# Patient Record
Sex: Female | Born: 1990 | Race: White | Hispanic: No | Marital: Married | Smoking: Never smoker
Health system: Southern US, Community
[De-identification: ages and names within clinical notes are randomized; demographics above are authoritative.]

## PROBLEM LIST (undated history)

## (undated) ENCOUNTER — Inpatient Hospital Stay: Payer: Self-pay

## (undated) ENCOUNTER — Inpatient Hospital Stay: Admission: AD | Payer: Self-pay | Source: Home / Self Care

## (undated) DIAGNOSIS — Z789 Other specified health status: Secondary | ICD-10-CM

## (undated) HISTORY — PX: NO PAST SURGERIES: SHX2092

---
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
## 2015-12-27 LAB — OB RESULTS CONSOLE HIV ANTIBODY (ROUTINE TESTING): HIV: NONREACTIVE

## 2015-12-27 LAB — OB RESULTS CONSOLE VARICELLA ZOSTER ANTIBODY, IGG: VARICELLA IGG: IMMUNE

## 2015-12-27 LAB — OB RESULTS CONSOLE HEPATITIS B SURFACE ANTIGEN: Hepatitis B Surface Ag: NEGATIVE

## 2015-12-27 LAB — OB RESULTS CONSOLE RUBELLA ANTIBODY, IGM: Rubella: IMMUNE

## 2015-12-30 ENCOUNTER — Other Ambulatory Visit: Payer: Self-pay | Admitting: Obstetrics and Gynecology

## 2015-12-30 DIAGNOSIS — Z369 Encounter for antenatal screening, unspecified: Secondary | ICD-10-CM

## 2016-01-09 ENCOUNTER — Ambulatory Visit
Admission: RE | Admit: 2016-01-09 | Discharge: 2016-01-09 | Disposition: A | Discharge status: Home / Self Care | Payer: Managed Care, Other (non HMO) | Source: Ambulatory Visit | Attending: Obstetrics and Gynecology | Admitting: Obstetrics and Gynecology

## 2016-01-09 ENCOUNTER — Ambulatory Visit (HOSPITAL_BASED_OUTPATIENT_CLINIC_OR_DEPARTMENT_OTHER)
Admission: RE | Admit: 2016-01-09 | Discharge: 2016-01-09 | Disposition: A | Discharge status: Home / Self Care | Payer: Managed Care, Other (non HMO) | Source: Ambulatory Visit | Attending: Maternal and Fetal Medicine | Admitting: Maternal and Fetal Medicine

## 2016-01-09 DIAGNOSIS — Z3481 Encounter for supervision of other normal pregnancy, first trimester: Secondary | ICD-10-CM | POA: Insufficient documentation

## 2016-01-09 DIAGNOSIS — Z3A12 12 weeks gestation of pregnancy: Secondary | ICD-10-CM | POA: Diagnosis not present

## 2016-01-09 DIAGNOSIS — Z36 Encounter for antenatal screening of mother: Secondary | ICD-10-CM | POA: Diagnosis not present

## 2016-01-09 DIAGNOSIS — Z369 Encounter for antenatal screening, unspecified: Secondary | ICD-10-CM | POA: Insufficient documentation

## 2016-01-09 HISTORY — DX: Other specified health status: Z78.9

## 2016-01-09 NOTE — Progress Notes (Signed)
Referring physician:  [facility] Length of Consultation: 40 minutes   Madison Knox  was referred to [facility] for genetic counseling to review prenatal screening and testing options.  This note summarizes the information we discussed.    We offered the following routine screening tests for this pregnancy:  First trimester screening, which includes nuchal translucency ultrasound screen and first trimester maternal serum marker screening.  The nuchal translucency has approximately an 80% detection rate for Down syndrome and can be positive for other chromosome abnormalities as well as congenital heart defects.  When combined with a maternal serum marker screening, the detection rate is up to 90% for Down syndrome and up to 97% for trisomy 18.     Maternal serum marker screening, a blood test that measures pregnancy proteins, can provide risk assessments for Down syndrome, trisomy 18, and open neural tube defects (spina bifida, anencephaly). Because it does not directly examine the fetus, it cannot positively diagnose or rule out these problems.  Targeted ultrasound uses high frequency sound waves to create an image of the developing fetus.  An ultrasound is often recommended as a routine means of evaluating the pregnancy.  It is also used to screen for fetal anatomy problems (for example, a heart defect) that might be suggestive of a chromosomal or other abnormality.   Should these screening tests indicate an increased concern, then the following additional testing options would be offered:  The chorionic villus sampling procedure is available for first trimester chromosome analysis.  This involves the withdrawal of a small amount of chorionic villi (tissue from the developing placenta).  Risk of pregnancy loss is estimated to be approximately 1 in 200 to 1 in 100 (0.5 to 1%).  There is approximately a 1% (1 in 100) chance that the CVS chromosome results will be unclear.   Chorionic villi cannot be tested for neural tube defects.     Amniocentesis involves the removal of a small amount of amniotic fluid from the sac surrounding the fetus with the use of a thin needle inserted through the maternal abdomen and uterus.  Ultrasound guidance is used throughout the procedure.  Fetal cells from amniotic fluid are directly evaluated and > 99.5% of chromosome problems and > 98% of open neural tube defects can be detected. This procedure is generally performed after the 15th week of pregnancy.  The main risks to this procedure include complications leading to miscarriage in less than 1 in 200 cases (0.5%).  As another option for information if the pregnancy is suspected to be an an increased chance for certain chromosome conditions, we also reviewed the availability of cell free fetal DNA testing from maternal blood to determine whether or not the baby may have either Down syndrome, trisomy 16, or trisomy 44.  This test utilizes a maternal blood sample and DNA sequencing technology to isolate circulating cell free fetal DNA from maternal plasma.  The fetal DNA can then be analyzed for DNA sequences that are derived from the three most common chromosomes involved in aneuploidy, chromosomes 13, 18, and 21.  If the overall amount of DNA is greater than the expected level for any of these chromosomes, aneuploidy is suspected.  While we do not consider it a replacement for invasive testing and karyotype analysis, a negative result from this testing would be reassuring, though not a guarantee of a normal chromosome complement for the baby.  An abnormal result is certainly suggestive of an abnormal chromosome complement, though  we would still recommend CVS or amniocentesis to confirm any findings from this testing.    Cystic Fibrosis and Spinal Muscular Atrophy (SMA) screening were also discussed with the patient. Both conditions are recessive, which means that both parents must be carriers in  order to have a child with the disease.  Cystic fibrosis (CF) is one of the most common genetic conditions in persons of Caucasian ancestry.  This condition occurs in approximately 1 in 2,500 Caucasian persons and results in thickened secretions in the lungs, digestive, and reproductive systems.  For a baby to be at risk for having CF, both of the parents must be carriers for this condition.  Approximately 1 in 64 Caucasian persons is a carrier for CF.  Current carrier testing looks for the most common mutations in the gene for CF and can detect approximately 90% of carriers in the Caucasian population.  This means that the carrier screening can greatly reduce, but cannot eliminate, the chance for an individual to have a child with CF.  If an individual is found to be a carrier for CF, then carrier testing would be available for the partner. As part of [name]'s newborn screening profile, all babies born in the state of [place] will have a two-tier screening process.  Specimens are first tested to determine the concentration of immunoreactive trypsinogen (IRT).  The top 5% of specimens with the highest IRT values then undergo DNA testing using a panel of over 40 common CF mutations. SMA is a neurodegenerative disorder that leads to atrophy of skeletal muscle and overall weakness.  This condition is also more prevalent in the Caucasian population, with 1 in 40-1 in 60 persons being a carrier and 1 in 6,000-1 in 10,000 children being affected.  There are multiple forms of the disease, with some causing death in infancy to other forms with survival into adulthood.  The genetics of SMA is complex, but carrier screening can detect up to 95% of carriers in the Caucasian population.  Similar to CF, a negative result can greatly reduce, but cannot eliminate, the chance to have a child with SMA.  We obtained a detailed family history and pregnancy history.  The father of the baby is reported to have a family  history of esophageal cancer in his brother and his father.  Both had a history of alcohol use which may have contributed.  Esophageal cancer is not thought to have strong genetic factors in most families, though there are some reports of genetic contributions in some studies.  The remainder of the family history was reported to be unremarkable for birth defects, mental retardation, recurrent pregnancy loss or known chromosome abnormalities.  Madison Knox stated that this is her second pregnancy.  She and her husband have a healthy 66 year old daughter.  She reported no complications or exposures that would be expected to increase the chance for birth defects.  After consideration of the options, Madison Knox elected to proceed with first trimester screening.  She declined CF and SMA carrier screening.  Madison Knox said that her husband works for [employer] and all of her testing at [employer] would be free of charge.  We therefore did discuss In[employer] testing which she declined today but may consider if the results of the first trimester screening are abnormal.  An ultrasound was performed at the time of the visit. Please refer to the ultrasound report for details of that study.   Fetal anatomy could not be assessed due to  early gestational age.  We therefore recommend an ultrasound for anatomy in the second trimester.  Madison Knox was encouraged to call with questions or concerns.  We can be contacted at [phone].   Cherly Andersoneborah F. Alexi Geibel, MS, CGC

## 2016-01-12 ENCOUNTER — Telehealth: Payer: Self-pay | Admitting: Obstetrics and Gynecology

## 2016-01-12 NOTE — Telephone Encounter (Signed)
Ms. Christell ConstantMoore  elected to undergo First Trimester screening on 01/09/2016.  To review, first trimester screening, includes nuchal translucency ultrasound screen and/or first trimester maternal serum marker screening.  The nuchal translucency has approximately an 80% detection rate for Down syndrome and can be positive for other chromosome abnormalities as well as heart defects.  When combined with a maternal serum marker screening, the detection rate is up to 90% for Down syndrome and up to 97% for trisomy 13 and 18.     The results of the First Trimester Nuchal Translucency and Biochemical Screening were within normal range.  The risk for Down syndrome is now estimated to be 1 in 2,783.  The risk for Trisomy 13/18 is less than 1 in 10,000  Should more definitive information be desired, we would offer amniocentesis.  Because we do not yet know the effectiveness of combined first and second trimester screening, we do not recommend a maternal serum screen to assess the chance for chromosome conditions.  However, if screening for neural tube defects is desired, maternal serum screening for AFP only can be performed between 15 and [redacted] weeks gestation.     Cherly Andersoneborah F. Makyla Bye, MS, CGC

## 2016-05-24 ENCOUNTER — Inpatient Hospital Stay
Admission: EM | Admit: 2016-05-24 | Discharge: 2016-05-24 | Disposition: A | Discharge status: Home / Self Care | Payer: Managed Care, Other (non HMO) | Attending: Obstetrics and Gynecology | Admitting: Obstetrics and Gynecology

## 2016-05-24 DIAGNOSIS — Z369 Encounter for antenatal screening, unspecified: Secondary | ICD-10-CM

## 2016-05-24 DIAGNOSIS — Z3A31 31 weeks gestation of pregnancy: Secondary | ICD-10-CM | POA: Diagnosis not present

## 2016-05-24 DIAGNOSIS — O368130 Decreased fetal movements, third trimester, not applicable or unspecified: Secondary | ICD-10-CM | POA: Diagnosis not present

## 2016-05-24 LAB — URINALYSIS, COMPLETE (UACMP) WITH MICROSCOPIC
Bilirubin Urine: NEGATIVE
Glucose, UA: NEGATIVE mg/dL
Hgb urine dipstick: NEGATIVE
Ketones, ur: NEGATIVE mg/dL
Nitrite: NEGATIVE
PROTEIN: NEGATIVE mg/dL
SPECIFIC GRAVITY, URINE: 1.004 — AB (ref 1.005–1.030)
pH: 6 (ref 5.0–8.0)

## 2016-05-24 NOTE — OB Triage Note (Signed)
Patient came in for lower left abdominal pain that began around 1500 today. At that time rated pain a 7 out of 10. She reports pain as constant. Now she says pain subsided to the point that she is not hurting and rating pain zero out of 10. Patient express concern about decrease fetal movement all day, which is the reason why she decided to come to the hospital. Patient denies vaginal bleeding and discharge of fluids. Patient currently on the monitor and baby so far looks like a category 1 strip.

## 2016-05-24 NOTE — Final Progress Note (Signed)
Madison Knox is a 25 y.o. G2P1001. She is at 1132w4d gestation. She presents today with c/o decreased fetal movement since this morning.  She states she tried to drink something cold, sweet, and was on her left side, and didn't feel her baby move.  She also reports a sharp, left lower quadrant abdominal pain around 3pm and reports she took a Tylenol and at the time it didn't help.  Now, she states the pain is gone and she hasn't felt it.  She denies ctxs, vaginal bleeding, LOF, abnormal vaginal discharge.  She does reports some occasional dysuria.  She now feels good fetal movement, but also expresses concern about having a 2 vessel cord.  She reports she was just informed of that on Monday, and is scheduled to have twice weekly NSTs.     O:  BP 118/75 (BP Location: Left Arm)   Pulse 94   Temp 97.7 F (36.5 C) (Oral)   Resp 18   LMP 10/16/2015       Results for orders placed or performed during the hospital encounter of 05/24/16 (from the past 48 hour(s))  Urinalysis, Complete w Microscopic   Collection Time: 05/24/16  7:56 PM  Result Value Ref Range   Color, Urine STRAW (A) YELLOW   APPearance CLEAR (A) CLEAR   Specific Gravity, Urine 1.004 (L) 1.005 - 1.030   pH 6.0 5.0 - 8.0   Glucose, UA NEGATIVE NEGATIVE mg/dL   Hgb urine dipstick NEGATIVE NEGATIVE   Bilirubin Urine NEGATIVE NEGATIVE   Ketones, ur NEGATIVE NEGATIVE mg/dL   Protein, ur NEGATIVE NEGATIVE mg/dL   Nitrite NEGATIVE NEGATIVE   Leukocytes, UA TRACE (A) NEGATIVE   RBC / HPF 0-5 0 - 5 RBC/hpf   WBC, UA 0-5 0 - 5 WBC/hpf   Bacteria, UA RARE (A) NONE SEEN   Squamous Epithelial / LPF 0-5 (A) NONE SEEN     Gen: NAD, AAOx3                                           Abd: FNTTP                                                     Ext: Non-tender, Nonedmeatous                      FHT: Baseline: 130 bpm/ moderate variability/ +accels/ no decels  TOCO: quiet SVE:  deferred    A/P:  25 y.o. G2P1001 6632w4d  with decreased fetal movement.    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  I explained this in detail to patient and her husband and reassurance given.  I believe it was an oversight.  [facility] tomorrow to clarify and let them know.   D/c home stable, precautions reviewed  [facility]  Carlean JewsMeredith Kassie Keng, CNM

## 2016-05-24 NOTE — Progress Notes (Signed)
   Triage visit for NST   Madison Knox is a 25 y.o. G2P1001. She is at 3169w4d gestation. She presents today with c/o decreased fetal movement since this morningElwin Sleight.  She states she tried to drink something cold, sweet, and was on her left side, and didn't feel her baby move.  She also reports a sharp, left lower quadrant abdominal pain around 3pm and reports she took a Tylenol and at the time it didn't help.  Now, she states the pain is gone and she hasn't felt it.  She denies ctxs, vaginal bleeding, LOF, abnormal vaginal discharge.  She does reports some occasional dysuria.  She now feels good fetal movement, but also expresses concern about having a 2 vessel cord.  She reports she was just informed of that on Monday, and is scheduled to have twice weekly NSTs.     O:  BP 118/75 (BP Location: Left Arm)   Pulse 94   Temp 97.7 F (36.5 C) (Oral)   Resp 18   LMP 10/16/2015  Results for orders placed or performed during the hospital encounter of 05/24/16 (from the past 48 hour(s))  Urinalysis, Complete w Microscopic   Collection Time: 05/24/16  7:56 PM  Result Value Ref Range   Color, Urine STRAW (A) YELLOW   APPearance CLEAR (A) CLEAR   Specific Gravity, Urine 1.004 (L) 1.005 - 1.030   pH 6.0 5.0 - 8.0   Glucose, UA NEGATIVE NEGATIVE mg/dL   Hgb urine dipstick NEGATIVE NEGATIVE   Bilirubin Urine NEGATIVE NEGATIVE   Ketones, ur NEGATIVE NEGATIVE mg/dL   Protein, ur NEGATIVE NEGATIVE mg/dL   Nitrite NEGATIVE NEGATIVE   Leukocytes, UA TRACE (A) NEGATIVE   RBC / HPF 0-5 0 - 5 RBC/hpf   WBC, UA 0-5 0 - 5 WBC/hpf   Bacteria, UA RARE (A) NONE SEEN   Squamous Epithelial / LPF 0-5 (A) NONE SEEN     Gen: NAD, AAOx3      Abd: FNTTP      Ext: Non-tender, Nonedmeatous    FHT: Baseline: 130 bpm/ moderate variability/ +accels/ no decels  TOCO: quiet SVE:  deferred    A/P:  25 y.o. G2P1001 2969w4d with decreased fetal movement.    Reactive NST, with moderate variability and accelerations, no  decels  Fetal Wellbeing: Reassuring  Category 1 fetal tracing   Strict FKC's daily  Preterm labor and warning s/s reviewed - increase hydration and call for worsening s/s of dysuria  Urine culture pending   [facility] appears that she actually has a 3 vessel cord with 2 umbilical arteries, which is a normal finding.  I explained this in detail to patient and her husband and reassurance given.  I believe it was an oversight.  [facility] tomorrow to clarify and let them know.   D/c home stable, precautions reviewed  [facility]  Carlean JewsMeredith Sigmon, CNM

## 2016-05-26 LAB — URINE CULTURE

## 2016-06-04 NOTE — L&D Delivery Note (Signed)
Delivery Note At 2:25 PM a viable female was delivered via Vaginal, Spontaneous Delivery (Presentation:oa ;  ).  APGAR 7/8; weight  .   Placenta status:intact  , .  Cord3v with the following complications: None.   Anesthesia:  CLE Episiotomy: None Lacerations: 1st degree Suture Repair: 2.0 3.0 vicryl Est. Blood Loss (mL): 200cc Uncomplicated SVD over intact perineum . Vigorous female placed on abd for 60 seconds before clamping cord  Mom to postpartum.  Baby to Couplet care / Skin to Skin.  Madison Knox 07/23/2016, 2:39 PM

## 2016-06-25 LAB — OB RESULTS CONSOLE GBS: STREP GROUP B AG: NEGATIVE

## 2016-06-25 LAB — OB RESULTS CONSOLE RPR: RPR: NONREACTIVE

## 2016-06-29 ENCOUNTER — Encounter: Payer: Self-pay | Admitting: Emergency Medicine

## 2016-06-29 ENCOUNTER — Emergency Department: Payer: Managed Care, Other (non HMO)

## 2016-06-29 ENCOUNTER — Emergency Department
Admission: EM | Admit: 2016-06-29 | Discharge: 2016-06-29 | Disposition: A | Discharge status: Home / Self Care | Payer: Managed Care, Other (non HMO) | Source: Home / Self Care | Attending: Emergency Medicine | Admitting: Emergency Medicine

## 2016-06-29 ENCOUNTER — Observation Stay
Admit: 2016-06-29 | Discharge: 2016-06-30 | Disposition: A | Discharge status: Home / Self Care | Payer: Managed Care, Other (non HMO) | Source: Intra-hospital | Attending: Obstetrics and Gynecology | Admitting: Obstetrics and Gynecology

## 2016-06-29 DIAGNOSIS — Y999 Unspecified external cause status: Secondary | ICD-10-CM | POA: Insufficient documentation

## 2016-06-29 DIAGNOSIS — S299XXA Unspecified injury of thorax, initial encounter: Secondary | ICD-10-CM

## 2016-06-29 DIAGNOSIS — Y9389 Activity, other specified: Secondary | ICD-10-CM

## 2016-06-29 DIAGNOSIS — Y92410 Unspecified street and highway as the place of occurrence of the external cause: Secondary | ICD-10-CM

## 2016-06-29 DIAGNOSIS — Z3A36 36 weeks gestation of pregnancy: Secondary | ICD-10-CM | POA: Insufficient documentation

## 2016-06-29 DIAGNOSIS — M546 Pain in thoracic spine: Secondary | ICD-10-CM

## 2016-06-29 DIAGNOSIS — M549 Dorsalgia, unspecified: Secondary | ICD-10-CM | POA: Insufficient documentation

## 2016-06-29 DIAGNOSIS — Z369 Encounter for antenatal screening, unspecified: Secondary | ICD-10-CM

## 2016-06-29 DIAGNOSIS — R51 Headache: Secondary | ICD-10-CM | POA: Insufficient documentation

## 2016-06-29 DIAGNOSIS — O9A213 Injury, poisoning and certain other consequences of external causes complicating pregnancy, third trimester: Secondary | ICD-10-CM | POA: Insufficient documentation

## 2016-06-29 DIAGNOSIS — O26893 Other specified pregnancy related conditions, third trimester: Principal | ICD-10-CM | POA: Insufficient documentation

## 2016-06-29 DIAGNOSIS — Z3A37 37 weeks gestation of pregnancy: Secondary | ICD-10-CM | POA: Insufficient documentation

## 2016-06-29 DIAGNOSIS — Z3493 Encounter for supervision of normal pregnancy, unspecified, third trimester: Secondary | ICD-10-CM

## 2016-06-29 DIAGNOSIS — R58 Hemorrhage, not elsewhere classified: Secondary | ICD-10-CM

## 2016-06-29 LAB — KLEIHAUER-BETKE STAIN
Fetal Cells %: 0 %
Quantitation Fetal Hemoglobin: 0 mL

## 2016-06-29 LAB — ABO/RH: ABO/RH(D): A POS

## 2016-06-29 MED ORDER — CYCLOBENZAPRINE HCL 5 MG PO TABS: 5.0000 mg | ORAL_TABLET | Freq: Three times a day (TID) | ORAL | 0 refills | Status: DC | PRN

## 2016-06-29 MED ORDER — CYCLOBENZAPRINE HCL 10 MG PO TABS
5.0000 mg | ORAL_TABLET | Freq: Three times a day (TID) | ORAL | Status: DC | PRN
  Administered 2016-06-30: 5 mg via ORAL
  Filled 2016-06-29 (×2): qty 1

## 2016-06-29 MED ORDER — ACETAMINOPHEN 325 MG PO TABS
650.0000 mg | ORAL_TABLET | Freq: Once | ORAL | Status: AC
  Administered 2016-06-29: 650 mg via ORAL

## 2016-06-29 MED ORDER — ACETAMINOPHEN 325 MG PO TABS
ORAL_TABLET | ORAL | Status: AC
  Administered 2016-06-29: 650 mg via ORAL
  Filled 2016-06-29: qty 2
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
## 2016-06-29 NOTE — ED Notes (Signed)
Pt second baby, 1st is living. Sees [facility] for OB.
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
## 2016-06-30 DIAGNOSIS — O26893 Other specified pregnancy related conditions, third trimester: Secondary | ICD-10-CM | POA: Diagnosis not present

## 2016-06-30 DIAGNOSIS — Z3493 Encounter for supervision of normal pregnancy, unspecified, third trimester: Secondary | ICD-10-CM

## 2016-06-30 LAB — COMPREHENSIVE METABOLIC PANEL
ALT: 11 U/L — ABNORMAL LOW (ref 14–54)
AST: 24 U/L (ref 15–41)
Albumin: 2.6 g/dL — ABNORMAL LOW (ref 3.5–5.0)
Alkaline Phosphatase: 89 U/L (ref 38–126)
Anion gap: 7 (ref 5–15)
BUN: 7 mg/dL (ref 6–20)
CHLORIDE: 107 mmol/L (ref 101–111)
CO2: 22 mmol/L (ref 22–32)
Calcium: 8.3 mg/dL — ABNORMAL LOW (ref 8.9–10.3)
Creatinine, Ser: 0.5 mg/dL (ref 0.44–1.00)
Glucose, Bld: 110 mg/dL — ABNORMAL HIGH (ref 65–99)
POTASSIUM: 3 mmol/L — AB (ref 3.5–5.1)
Sodium: 136 mmol/L (ref 135–145)
Total Bilirubin: 0.5 mg/dL (ref 0.3–1.2)
Total Protein: 6 g/dL — ABNORMAL LOW (ref 6.5–8.1)

## 2016-06-30 LAB — CBC
HCT: 30.9 % — ABNORMAL LOW (ref 35.0–47.0)
Hemoglobin: 10.8 g/dL — ABNORMAL LOW (ref 12.0–16.0)
MCH: 30 pg (ref 26.0–34.0)
MCHC: 35.1 g/dL (ref 32.0–36.0)
MCV: 85.4 fL (ref 80.0–100.0)
PLATELETS: 247 10*3/uL (ref 150–440)
RBC: 3.62 MIL/uL — ABNORMAL LOW (ref 3.80–5.20)
RDW: 13.1 % (ref 11.5–14.5)
WBC: 9.5 10*3/uL (ref 3.6–11.0)

## 2016-06-30 LAB — TYPE AND SCREEN
ABO/RH(D): A POS
Antibody Screen: NEGATIVE

## 2016-06-30 MED ORDER — ACETAMINOPHEN 325 MG PO TABS
650.0000 mg | ORAL_TABLET | ORAL | Status: DC | PRN
  Administered 2016-06-30: 650 mg via ORAL
  Filled 2016-06-30: qty 2

## 2016-06-30 MED ORDER — SODIUM CHLORIDE FLUSH 0.9 % IV SOLN
INTRAVENOUS | Status: AC
  Filled 2016-06-30: qty 10

## 2016-06-30 MED ORDER — BUTALBITAL-APAP-CAFFEINE 50-325-40 MG PO TABS: 1.0000 | ORAL_TABLET | Freq: Four times a day (QID) | ORAL | 0 refills | Status: DC | PRN

## 2016-06-30 MED ORDER — OXYCODONE-ACETAMINOPHEN 5-325 MG PO TABS: 1.0000 | ORAL_TABLET | ORAL | Status: DC | PRN

## 2016-06-30 MED ORDER — BUTALBITAL-APAP-CAFFEINE 50-325-40 MG PO TABS
1.0000 | ORAL_TABLET | Freq: Four times a day (QID) | ORAL | Status: DC | PRN
  Administered 2016-06-30: 1 via ORAL
  Filled 2016-06-30: qty 1

## 2016-06-30 MED ORDER — LACTATED RINGERS IV SOLN: 500.0000 mL | INTRAVENOUS | Status: DC | PRN

## 2016-06-30 NOTE — Discharge Instructions (Signed)
Introduction Patient Name: ________________________________________________ Patient Due Date: ____________________ What is a fetal movement count? A fetal movement count is the number of times that you feel your baby move during a certain amount of time. This may also be called a fetal kick count. A fetal movement count is recommended for every pregnant woman. You may be asked to start counting fetal movements as early as week 28 of your pregnancy. Pay attention to when your baby is most active. You may notice your baby's sleep and wake cycles. You may also notice things that make your baby move more. You should do a fetal movement count:  When your baby is normally most active.  At the same time each day. A good time to count movements is while you are resting, after having something to eat and drink. How do I count fetal movements? 1. Find a quiet, comfortable area. Sit, or lie down on your side. 2. Write down the date, the start time and stop time, and the number of movements that you felt between those two times. Take this information with you to your health care visits. 3. For 2 hours, count kicks, flutters, swishes, rolls, and jabs. You should feel at least 10 movements during 2 hours. 4. You may stop counting after you have felt 10 movements. 5. If you do not feel 10 movements in 2 hours, have something to eat and drink. Then, keep resting and counting for 1 hour. If you feel at least 4 movements during that hour, you may stop counting. Contact a health care provider if:  You feel fewer than 4 movements in 2 hours.  Your baby is not moving like he or she usually does. Date: ____________ Start time: ____________ Stop time: ____________ Movements: ____________ Date: ____________ Start time: ____________ Stop time: ____________ Movements: ____________ Date: ____________ Start time: ____________ Stop time: ____________ Movements: ____________ Date: ____________ Start time: ____________  Stop time: ____________ Movements: ____________ Date: ____________ Start time: ____________ Stop time: ____________ Movements: ____________ Date: ____________ Start time: ____________ Stop time: ____________ Movements: ____________ Date: ____________ Start time: ____________ Stop time: ____________ Movements: ____________ Date: ____________ Start time: ____________ Stop time: ____________ Movements: ____________ Date: ____________ Start time: ____________ Stop time: ____________ Movements: ____________ This information is not intended to replace advice given to you by your health care provider. Make sure you discuss any questions you have with your health care provider. Document Released: 06/20/2006 Document Revised: 01/18/2016 Document Reviewed: 06/30/2015 Elsevier Interactive Patient Education  2017 Else[employer] Inc[employer]  
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
## 2016-06-30 NOTE — Progress Notes (Signed)
Patient ID: Madison CornfieldRegan G Mcgreal, female   DOB: 1990-08-27, 26 y.o.   MRN: [id number]  FHT review: Baseline 120s, moderate variability, +accels, no decels. Continue monitoring.
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
## 2016-07-01 LAB — RPR: RPR: NONREACTIVE

## 2016-07-23 ENCOUNTER — Inpatient Hospital Stay: Payer: Managed Care, Other (non HMO) | Admitting: Registered Nurse

## 2016-07-23 ENCOUNTER — Inpatient Hospital Stay
Admission: EM | Admit: 2016-07-23 | Discharge: 2016-07-25 | DRG: 767 | Disposition: A | Discharge status: Home / Self Care | Payer: Managed Care, Other (non HMO) | Attending: Obstetrics and Gynecology | Admitting: Obstetrics and Gynecology

## 2016-07-23 DIAGNOSIS — O9081 Anemia of the puerperium: Secondary | ICD-10-CM | POA: Diagnosis present

## 2016-07-23 DIAGNOSIS — Z3A40 40 weeks gestation of pregnancy: Secondary | ICD-10-CM

## 2016-07-23 DIAGNOSIS — Z3493 Encounter for supervision of normal pregnancy, unspecified, third trimester: Secondary | ICD-10-CM | POA: Diagnosis present

## 2016-07-23 DIAGNOSIS — O9962 Diseases of the digestive system complicating childbirth: Secondary | ICD-10-CM | POA: Diagnosis present

## 2016-07-23 DIAGNOSIS — K589 Irritable bowel syndrome without diarrhea: Secondary | ICD-10-CM | POA: Diagnosis present

## 2016-07-23 DIAGNOSIS — Z302 Encounter for sterilization: Secondary | ICD-10-CM

## 2016-07-23 DIAGNOSIS — Z369 Encounter for antenatal screening, unspecified: Secondary | ICD-10-CM

## 2016-07-23 LAB — CBC
HEMATOCRIT: 36.2 % (ref 35.0–47.0)
Hemoglobin: 12.2 g/dL (ref 12.0–16.0)
MCH: 28.9 pg (ref 26.0–34.0)
MCHC: 33.7 g/dL (ref 32.0–36.0)
MCV: 85.7 fL (ref 80.0–100.0)
Platelets: 255 10*3/uL (ref 150–440)
RBC: 4.23 MIL/uL (ref 3.80–5.20)
RDW: 13.8 % (ref 11.5–14.5)
WBC: 8.4 10*3/uL (ref 3.6–11.0)

## 2016-07-23 LAB — CHLAMYDIA/NGC RT PCR (ARMC ONLY)
CHLAMYDIA TR: NOT DETECTED
N GONORRHOEAE: NOT DETECTED

## 2016-07-23 LAB — TYPE AND SCREEN
ABO/RH(D): A POS
ANTIBODY SCREEN: NEGATIVE

## 2016-07-23 LAB — RAPID HIV SCREEN (HIV 1/2 AB+AG)
HIV 1/2 Antibodies: NONREACTIVE
HIV-1 P24 Antigen - HIV24: NONREACTIVE

## 2016-07-23 MED ORDER — LACTATED RINGERS IV SOLN: 500.0000 mL | INTRAVENOUS | Status: DC | PRN

## 2016-07-23 MED ORDER — FENTANYL 2.5 MCG/ML W/ROPIVACAINE 0.2% IN NS 100 ML EPIDURAL INFUSION (ARMC-ANES)
EPIDURAL | Status: DC | PRN
  Administered 2016-07-23: 10 mL/h via EPIDURAL

## 2016-07-23 MED ORDER — OXYTOCIN 40 UNITS IN LACTATED RINGERS INFUSION - SIMPLE MED
INTRAVENOUS | Status: AC
  Filled 2016-07-23: qty 1000

## 2016-07-23 MED ORDER — OXYCODONE-ACETAMINOPHEN 5-325 MG PO TABS: 2.0000 | ORAL_TABLET | ORAL | Status: DC | PRN

## 2016-07-23 MED ORDER — DIPHENHYDRAMINE HCL 25 MG PO CAPS: 25.0000 mg | ORAL_CAPSULE | Freq: Four times a day (QID) | ORAL | Status: DC | PRN

## 2016-07-23 MED ORDER — BENZOCAINE-MENTHOL 20-0.5 % EX AERO
1.0000 "application " | INHALATION_SPRAY | CUTANEOUS | Status: DC | PRN
  Administered 2016-07-23: 1 via TOPICAL
  Filled 2016-07-23: qty 56

## 2016-07-23 MED ORDER — LIDOCAINE HCL (PF) 1 % IJ SOLN
INTRAMUSCULAR | Status: DC | PRN
  Administered 2016-07-23: 3 mL

## 2016-07-23 MED ORDER — MAGNESIUM HYDROXIDE 400 MG/5ML PO SUSP: 30.0000 mL | ORAL | Status: DC | PRN

## 2016-07-23 MED ORDER — ONDANSETRON HCL 4 MG/2ML IJ SOLN
4.0000 mg | Freq: Four times a day (QID) | INTRAMUSCULAR | Status: DC | PRN
  Administered 2016-07-23: 4 mg via INTRAVENOUS
  Filled 2016-07-23: qty 2

## 2016-07-23 MED ORDER — HYDROCODONE-ACETAMINOPHEN 5-325 MG PO TABS: 1.0000 | ORAL_TABLET | ORAL | Status: DC | PRN

## 2016-07-23 MED ORDER — MEASLES, MUMPS & RUBELLA VAC ~~LOC~~ INJ
0.5000 mL | INJECTION | Freq: Once | SUBCUTANEOUS | Status: DC
  Filled 2016-07-23: qty 0.5

## 2016-07-23 MED ORDER — IBUPROFEN 600 MG PO TABS
600.0000 mg | ORAL_TABLET | Freq: Four times a day (QID) | ORAL | Status: DC
  Administered 2016-07-23 – 2016-07-25 (×4): 600 mg via ORAL
  Filled 2016-07-23 (×4): qty 1

## 2016-07-23 MED ORDER — DIBUCAINE 1 % RE OINT: 1.0000 "application " | TOPICAL_OINTMENT | RECTAL | Status: DC | PRN

## 2016-07-23 MED ORDER — PRENATAL MULTIVITAMIN CH: 1.0000 | ORAL_TABLET | Freq: Every day | ORAL | Status: DC

## 2016-07-23 MED ORDER — ONDANSETRON HCL 4 MG PO TABS: 4.0000 mg | ORAL_TABLET | ORAL | Status: DC | PRN

## 2016-07-23 MED ORDER — ACETAMINOPHEN 325 MG PO TABS: 650.0000 mg | ORAL_TABLET | ORAL | Status: DC | PRN

## 2016-07-23 MED ORDER — FENTANYL 2.5 MCG/ML W/ROPIVACAINE 0.2% IN NS 100 ML EPIDURAL INFUSION (ARMC-ANES)
EPIDURAL | Status: AC
  Filled 2016-07-23: qty 100

## 2016-07-23 MED ORDER — WITCH HAZEL-GLYCERIN EX PADS: 1.0000 "application " | MEDICATED_PAD | CUTANEOUS | Status: DC | PRN

## 2016-07-23 MED ORDER — LIDOCAINE-EPINEPHRINE (PF) 1.5 %-1:200000 IJ SOLN
INTRAMUSCULAR | Status: DC | PRN
  Administered 2016-07-23: 3 mL via PERINEURAL

## 2016-07-23 MED ORDER — LIDOCAINE HCL (PF) 1 % IJ SOLN: 30.0000 mL | INTRAMUSCULAR | Status: DC | PRN

## 2016-07-23 MED ORDER — LACTATED RINGERS IV SOLN
INTRAVENOUS | Status: DC
  Administered 2016-07-23 – 2016-07-24 (×3): via INTRAVENOUS

## 2016-07-23 MED ORDER — COCONUT OIL OIL: 1.0000 "application " | TOPICAL_OIL | Status: DC | PRN

## 2016-07-23 MED ORDER — FERROUS SULFATE 325 (65 FE) MG PO TABS
325.0000 mg | ORAL_TABLET | Freq: Two times a day (BID) | ORAL | Status: DC
  Administered 2016-07-24 – 2016-07-25 (×2): 325 mg via ORAL
  Filled 2016-07-23 (×2): qty 1

## 2016-07-23 MED ORDER — OXYTOCIN 40 UNITS IN LACTATED RINGERS INFUSION - SIMPLE MED
2.5000 [IU]/h | INTRAVENOUS | Status: DC
  Administered 2016-07-23: 2.5 [IU]/h via INTRAVENOUS

## 2016-07-23 MED ORDER — OXYTOCIN BOLUS FROM INFUSION
500.0000 mL | Freq: Once | INTRAVENOUS | Status: AC
  Administered 2016-07-23: 500 mL via INTRAVENOUS

## 2016-07-23 MED ORDER — OXYTOCIN 10 UNIT/ML IJ SOLN
INTRAMUSCULAR | Status: AC
  Filled 2016-07-23: qty 2

## 2016-07-23 MED ORDER — AMMONIA AROMATIC IN INHA
RESPIRATORY_TRACT | Status: AC
  Filled 2016-07-23: qty 10

## 2016-07-23 MED ORDER — SENNOSIDES-DOCUSATE SODIUM 8.6-50 MG PO TABS
2.0000 | ORAL_TABLET | ORAL | Status: DC
  Administered 2016-07-25: 2 via ORAL
  Filled 2016-07-23: qty 2

## 2016-07-23 MED ORDER — SOD CITRATE-CITRIC ACID 500-334 MG/5ML PO SOLN
30.0000 mL | ORAL | Status: DC | PRN
  Filled 2016-07-23: qty 30

## 2016-07-23 MED ORDER — MISOPROSTOL 200 MCG PO TABS
ORAL_TABLET | ORAL | Status: AC
  Filled 2016-07-23: qty 4

## 2016-07-23 MED ORDER — BUTORPHANOL TARTRATE 1 MG/ML IJ SOLN
1.0000 mg | INTRAMUSCULAR | Status: DC | PRN
  Administered 2016-07-23: 1 mg via INTRAVENOUS

## 2016-07-23 MED ORDER — ONDANSETRON HCL 4 MG/2ML IJ SOLN: 4.0000 mg | INTRAMUSCULAR | Status: DC | PRN

## 2016-07-23 MED ORDER — BUTORPHANOL TARTRATE 1 MG/ML IJ SOLN
INTRAMUSCULAR | Status: AC
  Filled 2016-07-23: qty 1

## 2016-07-23 MED ORDER — SIMETHICONE 80 MG PO CHEW: 80.0000 mg | CHEWABLE_TABLET | ORAL | Status: DC | PRN

## 2016-07-23 MED ORDER — OXYCODONE-ACETAMINOPHEN 5-325 MG PO TABS
1.0000 | ORAL_TABLET | ORAL | Status: DC | PRN
  Administered 2016-07-23: 1 via ORAL
  Filled 2016-07-23: qty 1

## 2016-07-23 MED ORDER — ZOLPIDEM TARTRATE 5 MG PO TABS: 5.0000 mg | ORAL_TABLET | Freq: Every evening | ORAL | Status: DC | PRN

## 2016-07-23 MED ORDER — LIDOCAINE HCL (PF) 1 % IJ SOLN
INTRAMUSCULAR | Status: AC
  Filled 2016-07-23: qty 30

## 2016-07-23 MED ORDER — BUPIVACAINE HCL (PF) 0.25 % IJ SOLN
INTRAMUSCULAR | Status: DC | PRN
  Administered 2016-07-23 (×2): 4 mL via EPIDURAL

## 2016-07-23 NOTE — Discharge Summary (Signed)
Obstetric Discharge Summary Reason for Admission: onset of labor Prenatal Procedures: none Intrapartum Procedures: spontaneous vaginal delivery Postpartum Procedures: P.P. tubal ligation Complications-Operative and Postpartum: first degree perineal laceration Hemoglobin  Date Value Ref Range Status  07/24/2016 10.6 (L) 12.0 - 16.0 g/dL Final   HCT  Date Value Ref Range Status  07/24/2016 29.9 (L) 35.0 - 47.0 % Final    Physical Exam: 07/25/16 General: alert and cooperative Lochia: appropriate Uterine Fundus: firm Incision: healing well DVT Evaluation: No evidence of DVT seen on physical exam.  Discharge Diagnoses: Term Pregnancy-delivered, s/p postpartum tubal ligation  Discharge Information: Date: 07/25/2016 Activity: pelvic rest Diet: routine Medications: Motrin, oxycodone  Immediate release  Condition: stable Instructions: refer to practice specific booklet Discharge to: home Follow-up Information    Leola Brazilhelsea C Ward, MD. Go on 08/09/2016.   Specialty:  Obstetrics and Gynecology Why:  Post op follow up appointment scheduled for Thursday August 09, 2016 at 9:45 AM Contact information: [address] [facility] [place] [address] [phone]           Newborn Data: Live born female  Birth Weight:  3270g (7lb 3oz) APGAR: 8, 9   Home with mother.  Karena AddisonSigmon, Immanuel Fedak C 07/25/2016, 9:12 AM

## 2016-07-23 NOTE — Anesthesia Procedure Notes (Signed)
Epidural Patient location during procedure: OB Start time: 07/23/2016 8:20 AM End time: 07/23/2016 8:24 AM  Staffing Anesthesiologist: Yevette EdwardsADAMS, JAMES G Resident/CRNA: Stormy FabianURTIS, Manjot Beumer Performed: resident/CRNA   Preanesthetic Checklist Completed: patient identified, site marked, surgical consent, pre-op evaluation, timeout performed, IV checked, risks and benefits discussed and monitors and equipment checked  Epidural Patient position: sitting Prep: Betadine Patient monitoring: heart rate, continuous pulse ox and blood pressure Approach: midline Location: L4-L5 Injection technique: LOR saline  Needle:  Needle type: Tuohy  Needle gauge: 17 G Needle length: 9 cm and 9 Needle insertion depth: 6 cm Catheter type: closed end flexible Catheter size: 19 Gauge Catheter at skin depth: 11 cm Test dose: negative and 1.5% lidocaine with Epi 1:200 K  Assessment Sensory level: T10 Events: blood not aspirated, injection not painful, no injection resistance, negative IV test and no paresthesia  Additional Notes Pt. Evaluated and documentation done after procedure finished. Patient identified. Risks/Benefits/Options discussed with patient including but not limited to bleeding, infection, nerve damage, paralysis, failed block, incomplete pain control, headache, blood pressure changes, nausea, vomiting, reactions to medication both or allergic, itching and postpartum back pain. Confirmed with bedside nurse the patient's most recent platelet count. Confirmed with patient that they are not currently taking any anticoagulation, have any bleeding history or any family history of bleeding disorders. Patient expressed understanding and wished to proceed. All questions were answered. Sterile technique was used throughout the entire procedure. Please see nursing notes for vital signs. Test dose was given through epidural catheter and negative prior to continuing to dose epidural or start infusion. Warning signs of  high block given to the patient including shortness of breath, tingling/numbness in hands, complete motor block, or any concerning symptoms with instructions to call for help. Patient was given instructions on fall risk and not to get out of bed. All questions and concerns addressed with instructions to call with any issues or inadequate analgesia.   Patient tolerated the insertion well without immediate complications.Reason for block:procedure for pain

## 2016-07-23 NOTE — H&P (Signed)
Madison Knox is a 26 y.o. female presenting for labor contractions. CC:  Patient's last menstrual period was 10/16/2015. Estimated Date of Delivery: 07/22/16. This is a planned pregnancy.   ALLERGIES:  Allergies as of 12/27/2015  . (No Known Allergies)   MEDS:  PNV and 1 mg folic acid  ZOX:WRUEAVWPMH:OBESITY, IBS OB History    Gravida Para Term Preterm AB Living   2 1 1     1    SAB TAB Ectopic Multiple Live Births           1      Past Surgical History:  Procedure Laterality Date  . NO PAST SURGERIES     Family History:. Hypertension Mother  . Diabetes mellitus Paternal Uncle  Enlarged heart and COPD: Dad Social History:  reports that she has never smoked. She has never used smokeless tobacco. She reports that she does not drink alcohol or use drugs.     Maternal Diabetes: 1 h GCt 124 Genetic Screening: AFP 1:10,000 Maternal Ultrasounds/Referrals: Normal Anatomy Fetal Ultrasounds or other Referrals:  None Maternal Substance Abuse: Neg Significant Maternal Medications:  PNV Significant Maternal Lab Results:  Per above Other Comments: None  Review of Systems  Constitutional: Negative.   HENT: Negative.   Eyes: Negative.   Respiratory: Negative.   Cardiovascular: Negative.   Gastrointestinal: Negative.   Genitourinary: Negative.   Musculoskeletal: Negative.   Skin: Negative.   Neurological: Negative for tingling (PNV).  Endo/Heme/Allergies: Negative.   Psychiatric/Behavioral: Negative.    Maternal Medical History:  Reason for admission: Contractions.   Contractions: Onset was 1-2 hours ago.   Frequency: regular.   Perceived severity is moderate.    Fetal activity: Perceived fetal activity is normal.   Last perceived fetal movement was within the past hour.    Prenatal complications: no prenatal complications Prenatal Complications - Diabetes: none.    Dilation: 3.5 Effacement (%): 50 Station: -2 Exam by:: ALogan Bores. Evans, RN Blood pressure 119/81, pulse 89,  temperature 98.1 F (36.7 C), temperature source Oral, resp. rate 19, height 5\' 6"  (1.676 m), weight 95.7 kg (211 lb), last menstrual period 10/16/2015. Exam Physical Exam   GENERAL: 26 y.o.yo female in NAD. A & O x 3. HEENT:Normocephalic. No thryomegaly, no nodes. Eyes non-icteric, PERL. Teeth: dentition is good, no missing teeth or periodontal disease noted. Throat: no exudate, no tonsillar enlargement. HEART:S1S2, No M/R/G. LUNGS:CTA bilat. No W/R/R. UJW:JXBJYNABD:Gravid  Extremities: no Edema, varicose veins or rashes present. Skin: Warm and dry to touch. No rashes or lesions presentGENERAL: 26 y.o.yo female in  PRENATAL LABS: ABO, Rh: --/--/A POS (01/26 2243) Antibody: NEG (01/26 2243) Rubella:   RPR: Non Reactive (01/26 2243)  HBsAg:   Neg HIV:   Neg GBS:   Neg Varicella: Immune Rubella: immune  Assessment/Plan: A: IUP at term 2. GBS neg P: Admit to [facility]place 2. Antic SVD. 3. Continuous fetal and uterine monitors   Sharee PimpleCaron W Schneider Warchol 07/23/2016, 6:25 AM

## 2016-07-23 NOTE — Progress Notes (Signed)
Madison Knox is a 26 y.o. G2P1001 at 7839w1d by dating.  Subjective: "I need some medicine"  Objective: BP 119/81 (BP Location: Left Arm)   Pulse 89   Temp 98.1 F (36.7 C) (Oral)   Resp 19   Ht 5\' 6"  (1.676 m)   Wt 95.7 kg (211 lb)   LMP 10/16/2015   BMI 34.06 kg/m  No intake/output data recorded. No intake/output data recorded.  FHT: 130, occas variable to 100, mod variability, Cat 2 UC:  q3-4 mins, mod to palpation SVE:   Dilation: 3.5 Effacement (%): 50 Station: -2 Exam by:: De BlanchA. Evans, RN  Labs: Lab Results  Component Value Date   WBC 9.5 06/29/2016   HGB 10.8 (L) 06/29/2016   HCT 30.9 (L) 06/29/2016   MCV 85.4 06/29/2016   PLT 247 06/29/2016    Assessment / Plan: A: IUP at term 2. GBS neg P: Fetal and uterine monitors. 2. Monitor UV 3.Antic SVd 4. Plans epidural   Sharee Pimplearon W Hooper Petteway 07/23/2016, 7:30 AM

## 2016-07-23 NOTE — Anesthesia Preprocedure Evaluation (Signed)
Anesthesia Evaluation  Patient identified by MRN, date of birth, ID band Patient awake    Reviewed: Allergy & Precautions, H&P , NPO status , Patient's Chart, lab work & pertinent test results  History of Anesthesia Complications Negative for: history of anesthetic complications  Airway Mallampati: II  TM Distance: >3 FB Neck ROM: full    Dental no notable dental hx.    Pulmonary neg pulmonary ROS,    Pulmonary exam normal        Cardiovascular negative cardio ROS Normal cardiovascular exam     Neuro/Psych    GI/Hepatic negative GI ROS, Neg liver ROS,   Endo/Other  negative endocrine ROS  Renal/GU negative Renal ROS     Musculoskeletal   Abdominal   Peds  Hematology negative hematology ROS (+)   Anesthesia Other Findings   Reproductive/Obstetrics (+) Pregnancy                             Anesthesia Physical Anesthesia Plan  ASA: II  Anesthesia Plan: Epidural   Post-op Pain Management:    Induction:   Airway Management Planned:   Additional Equipment:   Intra-op Plan:   Post-operative Plan:   Informed Consent: I have reviewed the patients History and Physical, chart, labs and discussed the procedure including the risks, benefits and alternatives for the proposed anesthesia with the patient or authorized representative who has indicated his/her understanding and acceptance.     Plan Discussed with: CRNA  Anesthesia Plan Comments:         Anesthesia Quick Evaluation

## 2016-07-23 NOTE — Progress Notes (Signed)
Patient ID: Kathreen CornfieldRegan G Tramel, female   DOB: 03-Jan-1991, 26 y.o.   MRN: [id number] AROM for blood tinged fluid, FHR 130. Cx 5/100/vtx-1. Report to oncoming physician Dr Karleen HampshireJSchermerhorn.

## 2016-07-23 NOTE — OB Triage Note (Signed)
Patient came in for observation for labor evaluation. Patient reports uterine contractions every five minutes since 0130. Patient rates pain 9 out of 10. Patient denies leaking of fluid, vaginal bleeding and spotting. Patient states she has been feeling the baby move fine. Vital signs stable and patient afebrile. FHR baseline 135 with moderate variability with accelerations 15 x 15 and no decelerations. Family at bedside. Will continue to monitor.

## 2016-07-24 ENCOUNTER — Inpatient Hospital Stay: Payer: Managed Care, Other (non HMO) | Admitting: Anesthesiology

## 2016-07-24 ENCOUNTER — Encounter: Payer: Self-pay | Admitting: *Deleted

## 2016-07-24 ENCOUNTER — Encounter
Admission: EM | Disposition: A | Discharge status: Home / Self Care | Payer: Self-pay | Source: Home / Self Care | Attending: Obstetrics and Gynecology

## 2016-07-24 HISTORY — PX: TUBAL LIGATION: SHX77

## 2016-07-24 LAB — CBC
HEMATOCRIT: 29.9 % — AB (ref 35.0–47.0)
HEMOGLOBIN: 10.6 g/dL — AB (ref 12.0–16.0)
MCH: 29.8 pg (ref 26.0–34.0)
MCHC: 35.4 g/dL (ref 32.0–36.0)
MCV: 84.1 fL (ref 80.0–100.0)
Platelets: 210 10*3/uL (ref 150–440)
RBC: 3.56 MIL/uL — ABNORMAL LOW (ref 3.80–5.20)
RDW: 13.8 % (ref 11.5–14.5)
WBC: 8.7 10*3/uL (ref 3.6–11.0)

## 2016-07-24 LAB — RPR: RPR Ser Ql: NONREACTIVE

## 2016-07-24 SURGERY — LIGATION, FALLOPIAN TUBE, POSTPARTUM
Anesthesia: General | Laterality: Bilateral

## 2016-07-24 MED ORDER — KETOROLAC TROMETHAMINE 30 MG/ML IJ SOLN
INTRAMUSCULAR | Status: DC | PRN
  Administered 2016-07-24: 30 mg via INTRAVENOUS

## 2016-07-24 MED ORDER — ONDANSETRON HCL 4 MG/2ML IJ SOLN
INTRAMUSCULAR | Status: AC
  Filled 2016-07-24: qty 2

## 2016-07-24 MED ORDER — OXYCODONE HCL 5 MG PO TABS: 5.0000 mg | ORAL_TABLET | Freq: Once | ORAL | Status: DC | PRN

## 2016-07-24 MED ORDER — SCOPOLAMINE 1 MG/3DAYS TD PT72
MEDICATED_PATCH | TRANSDERMAL | Status: AC
  Administered 2016-07-24: 1.5 mg via TRANSDERMAL
  Filled 2016-07-24: qty 1

## 2016-07-24 MED ORDER — OXYCODONE HCL 5 MG/5ML PO SOLN: 5.0000 mg | Freq: Once | ORAL | Status: DC | PRN

## 2016-07-24 MED ORDER — OXYCODONE HCL 5 MG/5ML PO SOLN: 5.0000 mg | Freq: Once | ORAL | Status: AC | PRN

## 2016-07-24 MED ORDER — LIDOCAINE HCL (CARDIAC) 20 MG/ML IV SOLN
INTRAVENOUS | Status: DC | PRN
  Administered 2016-07-24: 80 mg via INTRAVENOUS

## 2016-07-24 MED ORDER — OXYCODONE HCL 5 MG PO TABS
5.0000 mg | ORAL_TABLET | Freq: Once | ORAL | Status: AC | PRN
  Administered 2016-07-24: 5 mg via ORAL

## 2016-07-24 MED ORDER — MIDAZOLAM HCL 2 MG/2ML IJ SOLN
INTRAMUSCULAR | Status: DC | PRN
  Administered 2016-07-24: 2 mg via INTRAVENOUS

## 2016-07-24 MED ORDER — SCOPOLAMINE 1 MG/3DAYS TD PT72
1.0000 | MEDICATED_PATCH | TRANSDERMAL | Status: DC
  Administered 2016-07-24: 1.5 mg via TRANSDERMAL

## 2016-07-24 MED ORDER — FENTANYL CITRATE (PF) 100 MCG/2ML IJ SOLN
INTRAMUSCULAR | Status: AC
  Administered 2016-07-24: 25 ug via INTRAVENOUS
  Filled 2016-07-24: qty 2

## 2016-07-24 MED ORDER — PROPOFOL 10 MG/ML IV BOLUS
INTRAVENOUS | Status: DC | PRN
  Administered 2016-07-24: 150 mg via INTRAVENOUS

## 2016-07-24 MED ORDER — ROCURONIUM BROMIDE 100 MG/10ML IV SOLN
INTRAVENOUS | Status: DC | PRN
  Administered 2016-07-24: 20 mg via INTRAVENOUS

## 2016-07-24 MED ORDER — DEXAMETHASONE SODIUM PHOSPHATE 10 MG/ML IJ SOLN
INTRAMUSCULAR | Status: DC | PRN
  Administered 2016-07-24: 5 mg via INTRAVENOUS

## 2016-07-24 MED ORDER — NEOSTIGMINE METHYLSULFATE 5 MG/5ML IV SOSY
PREFILLED_SYRINGE | INTRAVENOUS | Status: AC
  Filled 2016-07-24: qty 5

## 2016-07-24 MED ORDER — LACTATED RINGERS IV SOLN
INTRAVENOUS | Status: DC | PRN
  Administered 2016-07-24 (×2): via INTRAVENOUS

## 2016-07-24 MED ORDER — DEXAMETHASONE SODIUM PHOSPHATE 10 MG/ML IJ SOLN
INTRAMUSCULAR | Status: AC
  Filled 2016-07-24: qty 1

## 2016-07-24 MED ORDER — KETOROLAC TROMETHAMINE 30 MG/ML IJ SOLN
INTRAMUSCULAR | Status: AC
  Filled 2016-07-24: qty 1

## 2016-07-24 MED ORDER — FENTANYL CITRATE (PF) 100 MCG/2ML IJ SOLN
INTRAMUSCULAR | Status: DC | PRN
  Administered 2016-07-24: 100 ug via INTRAVENOUS

## 2016-07-24 MED ORDER — LIDOCAINE HCL (PF) 2 % IJ SOLN
INTRAMUSCULAR | Status: AC
Start: 2016-07-24 — End: 2016-07-24
  Filled 2016-07-24: qty 2

## 2016-07-24 MED ORDER — FENTANYL CITRATE (PF) 100 MCG/2ML IJ SOLN
25.0000 ug | INTRAMUSCULAR | Status: DC | PRN
  Administered 2016-07-24 (×5): 25 ug via INTRAVENOUS

## 2016-07-24 MED ORDER — BUPIVACAINE LIPOSOME 1.3 % IJ SUSP
INTRAMUSCULAR | Status: AC
  Filled 2016-07-24: qty 20

## 2016-07-24 MED ORDER — EPHEDRINE SULFATE 50 MG/ML IJ SOLN
INTRAMUSCULAR | Status: AC
  Filled 2016-07-24: qty 1

## 2016-07-24 MED ORDER — BUPIVACAINE HCL (PF) 0.5 % IJ SOLN
INTRAMUSCULAR | Status: DC | PRN
  Administered 2016-07-24: 50 mL

## 2016-07-24 MED ORDER — SUCCINYLCHOLINE CHLORIDE 20 MG/ML IJ SOLN
INTRAMUSCULAR | Status: AC
  Filled 2016-07-24: qty 1

## 2016-07-24 MED ORDER — OXYCODONE HCL 5 MG PO TABS
ORAL_TABLET | ORAL | Status: AC
  Administered 2016-07-24: 5 mg via ORAL
  Filled 2016-07-24: qty 1

## 2016-07-24 MED ORDER — SUCCINYLCHOLINE CHLORIDE 20 MG/ML IJ SOLN
INTRAMUSCULAR | Status: DC | PRN
  Administered 2016-07-24: 100 mg via INTRAVENOUS

## 2016-07-24 MED ORDER — FENTANYL CITRATE (PF) 100 MCG/2ML IJ SOLN
INTRAMUSCULAR | Status: AC
  Filled 2016-07-24: qty 2

## 2016-07-24 MED ORDER — MIDAZOLAM HCL 2 MG/2ML IJ SOLN
INTRAMUSCULAR | Status: AC
  Filled 2016-07-24: qty 2

## 2016-07-24 MED ORDER — GLYCOPYRROLATE 0.2 MG/ML IJ SOLN
INTRAMUSCULAR | Status: AC
  Filled 2016-07-24: qty 2

## 2016-07-24 MED ORDER — PROPOFOL 10 MG/ML IV BOLUS
INTRAVENOUS | Status: AC
  Filled 2016-07-24: qty 20

## 2016-07-24 MED ORDER — EPHEDRINE SULFATE 50 MG/ML IJ SOLN
INTRAMUSCULAR | Status: DC | PRN
  Administered 2016-07-24: 15 mg via INTRAVENOUS

## 2016-07-24 MED ORDER — BUPIVACAINE HCL (PF) 0.5 % IJ SOLN
INTRAMUSCULAR | Status: AC
  Filled 2016-07-24: qty 30

## 2016-07-24 SURGICAL SUPPLY — 29 items
BLADE SURG SZ11 CARB STEEL (BLADE) ×2 IMPLANT
CATH TRAY METER 16FR LF (MISCELLANEOUS) ×2 IMPLANT
CHLORAPREP W/TINT 26ML (MISCELLANEOUS) ×2 IMPLANT
DRAPE LAPAROTOMY 100X77 ABD (DRAPES) ×2 IMPLANT
DRESSING TELFA 4X3 1S ST N-ADH (GAUZE/BANDAGES/DRESSINGS) ×2 IMPLANT
DRSG TEGADERM 2-3/8X2-3/4 SM (GAUZE/BANDAGES/DRESSINGS) ×2 IMPLANT
ELECT CAUTERY BLADE 6.4 (BLADE) ×2 IMPLANT
ELECT REM PT RETURN 9FT ADLT (ELECTROSURGICAL) ×2
ELECTRODE REM PT RTRN 9FT ADLT (ELECTROSURGICAL) ×1 IMPLANT
GLOVE PI ORTHOPRO 6.5 (GLOVE) ×1
GLOVE PI ORTHOPRO STRL 6.5 (GLOVE) ×1 IMPLANT
GLOVE SURG SYN 6.5 ES PF (GLOVE) ×2 IMPLANT
GOWN STRL REUS W/ TWL LRG LVL3 (GOWN DISPOSABLE) ×2 IMPLANT
GOWN STRL REUS W/TWL LRG LVL3 (GOWN DISPOSABLE) ×2
KIT RM TURNOVER CYSTO AR (KITS) ×2 IMPLANT
LABEL OR SOLS (LABEL) ×2 IMPLANT
LIGASURE BLUNT 5MM 37CM (INSTRUMENTS) IMPLANT
LIQUID BAND (GAUZE/BANDAGES/DRESSINGS) ×2 IMPLANT
NDL SAFETY 22GX1.5 (NEEDLE) ×2 IMPLANT
NS IRRIG 500ML POUR BTL (IV SOLUTION) ×2 IMPLANT
PACK BASIN MINOR ARMC (MISCELLANEOUS) ×2 IMPLANT
RETRACTOR WOUND ALXS 18CM SML (MISCELLANEOUS) ×1 IMPLANT
RTRCTR WOUND ALEXIS O 18CM SML (MISCELLANEOUS) ×2
SPONGE LAP 18X18 5 PK (GAUZE/BANDAGES/DRESSINGS) ×2 IMPLANT
SUT MNCRL AB 4-0 PS2 18 (SUTURE) IMPLANT
SUT PLAIN GUT 0 (SUTURE) IMPLANT
SUT VIC AB 0 CT1 36 (SUTURE) ×8 IMPLANT
SUT VIC AB 2-0 UR6 27 (SUTURE) IMPLANT
SYRINGE 10CC LL (SYRINGE) ×2 IMPLANT

## 2016-07-24 NOTE — Transfer of Care (Signed)
Immediate Anesthesia Transfer of Care Note  Patient: Madison CornfieldRegan G Cobarrubias  Procedure(s) Performed: Procedure(s): POST PARTUM TUBAL LIGATION (Bilateral)  Patient Location: PACU  Anesthesia Type:General  Level of Consciousness: sedated  Airway & Oxygen Therapy: Patient Spontanous Breathing and Patient connected to face mask oxygen  Post-op Assessment: Report given to RN and Post -op Vital signs reviewed and stable  Post vital signs: Reviewed and stable  Last Vitals:  Vitals:   07/24/16 0926 07/24/16 1130  BP: 102/80 136/86  Pulse: 80 67  Resp: 18 (!) 27  Temp: 36.2 C (!) 35.8 C    Complications: No apparent anesthesia complications

## 2016-07-24 NOTE — Anesthesia Postprocedure Evaluation (Signed)
Anesthesia Post Note  Patient: Madison Knox  Procedure(s) Performed: Procedure(s) (LRB): POST PARTUM TUBAL LIGATION (Bilateral)  Patient location during evaluation: PACU Anesthesia Type: General Level of consciousness: awake and alert Pain management: pain level controlled Vital Signs Assessment: post-procedure vital signs reviewed and stable Respiratory status: spontaneous breathing, nonlabored ventilation, respiratory function stable and patient connected to nasal cannula oxygen Cardiovascular status: blood pressure returned to baseline and stable Postop Assessment: no signs of nausea or vomiting Anesthetic complications: no     Last Vitals:  Vitals:   07/24/16 1244 07/24/16 1346  BP: 128/83 116/70  Pulse: 73 83  Resp: 20 18  Temp: (!) 35.8 C 36.6 C    Last Pain:  Vitals:   07/24/16 1346  TempSrc: Oral  PainSc:                  Cleda MccreedyJoseph K Ismael Treptow

## 2016-07-24 NOTE — Progress Notes (Signed)
PPD #1, SVD, baby girl "[name]"  S:  Reports feeling good, no concerns             Tolerating po/ No nausea or vomiting             Bleeding is light             Pain controlled with Motrin             Up ad lib / ambulatory / voiding QS  Newborn breast feeding - going well   O:               VS: BP 105/64 (BP Location: Left Arm)   Pulse 81   Temp 97.7 F (36.5 C) (Oral)   Resp 18   Ht 5\' 6"  (1.676 m)   Wt 95.7 kg (211 lb)   LMP 10/16/2015   SpO2 98%   Breastfeeding? Unknown   BMI 34.06 kg/m    LABS:              Recent Labs  07/23/16 0642 07/24/16 0705  WBC 8.4 8.7  HGB 12.2 10.6*  PLT 255 210               Blood type: --/--/A POS (02/19 16100642)  Rubella: Immune (07/25 0000)                     I&O: Intake/Output      02/19 0701 - 02/20 0700 02/20 0701 - 02/21 0700   I.V. (mL/kg) 1387 (14.5)    Total Intake(mL/kg) 1387 (14.5)    Urine (mL/kg/hr) 1060 (0.5)    Blood 200 (0.1)    Total Output 1260     Net +127          Urine Occurrence 1 x                  Physical Exam:             Alert and oriented X3  Lungs: Clear and unlabored  Heart: regular rate and rhythm / no mumurs  Abdomen: soft, non-tender, non-distended              Fundus: firm, non-tender, U-1  Perineum: well approximated 1st degree laceration healing well, no significant edema, no significant edema  Lochia: appropriate, no clots  Extremities: mild LE edema, no calf pain or tenderness    A: PPD # 1   Doing well - stable status  ABL Anemia - on FE  P: Routine post partum orders  Planning BTL today at 9:45 with Dr. Elesa MassedWard  Anticipate discharge home tomorrow  Madison Knox, CNM
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
## 2016-07-24 NOTE — Interval H&P Note (Signed)
History and Physical Interval Note:  07/24/2016 8:15 AM  Madison Knox  Has requested surgery, with the diagnosis of desires sterilization  The various methods of treatment have been discussed with the patient and family. After consideration of risks, benefits and other options for treatment, the patient has consented to  Procedure(s): POST PARTUM TUBAL LIGATION (Bilateral) as a surgical intervention .  The patient's history has been reviewed, patient examined, no change in status, stable for surgery.  I have reviewed the patient's chart and labs.  Questions were answered to the patient's satisfaction.  Consents signed.   [name] C Jamis Kryder

## 2016-07-24 NOTE — Anesthesia Procedure Notes (Signed)
Procedure Name: Intubation Date/Time: 07/24/2016 10:08 AM Performed by: Doreen Salvage Pre-anesthesia Checklist: Patient identified, Patient being monitored, Timeout performed, Emergency Drugs available and Suction available Patient Re-evaluated:Patient Re-evaluated prior to inductionOxygen Delivery Method: Circle system utilized Preoxygenation: Pre-oxygenation with 100% oxygen Intubation Type: IV induction Ventilation: Mask ventilation without difficulty Laryngoscope Size: Mac and 3 Grade View: Grade I Tube type: Oral Tube size: 7.0 mm Number of attempts: 1 Airway Equipment and Method: Stylet Placement Confirmation: ETT inserted through vocal cords under direct vision,  positive ETCO2 and breath sounds checked- equal and bilateral Secured at: 21 cm Tube secured with: Tape Dental Injury: Teeth and Oropharynx as per pre-operative assessment

## 2016-07-24 NOTE — Op Note (Addendum)
  Post Partum Tubal Ligation  Indication for procedure: desired permanent sterilization  Pre-op diagnosis: s/p term vaginal delivery, desired permanent sterilization Post-op diagnosis: same Procedure: post partum bilateral tubal ligation Surgeon: Chelsea Knox Assist: none Anesthesia: general  IVF: 500cc EBL: minimal UOP: none Findings: patent bilateral tubes, normal post-gravid uterine fundus Specimens: portion of left tube with fimbriated end, portion of right tube with fimbriated end Complications: none apparent Disposition: stable to PACU  Procedure in detail: The patient was seen and confirmed desire for permanent sterilization.  She was identified as Madison Knox and was brought to the OR.  General anesthesia was administered, and the patient was prepped and draped in the usual sterile fashion. She was kept supine.  A surgical time-out was called.  An 11-blade was used to incise the skin in the infraumbilical area.  The subcutaneous tissues were dissected to and then through the fascia, and the peritoneum was grasped and divided.  An alexis retractor was placed in the abdominal cavity and positioned.  The uterus was identified, and the right tube was grasped with a babcock and traced to the fimbriated end.  A kelly clamp was placed in a step-wise fashion along the mesosalpinx and metzenbaums were used to transect the tube from the underlying tissue.  2-0 Vicryl was used to tie the remaining tissue.  The same steps were used on the left side.  All dissected ends were found to be hemostatic.  The alexis retractor was removed, and the fascia was reapproximated with 2-0 vicryl. 40cc of long- and short-acting Bupivacaine was injected into the fascia circumferentially.  The subcutaneous tissue was irrigated and then the skin was closed with 4-0 monocryl.  The skin was covered in surgical glue.  10cc of long- and short-acting bupivacaine was injected into the subcutaneous tissue and skin. The  sponge, needle, and instrument counts were correct x2.  The patient tolerated the procedure and was brought to PACU in a stable condition.  Madison Plumberhelsea Ward, MD Attending Obstetrician and Gynecologist [facility]

## 2016-07-24 NOTE — Anesthesia Post-op Follow-up Note (Cosign Needed)
Anesthesia QCDR form completed.        

## 2016-07-25 LAB — SURGICAL PATHOLOGY

## 2016-07-25 MED ORDER — IBUPROFEN 600 MG PO TABS: 600.0000 mg | ORAL_TABLET | Freq: Four times a day (QID) | ORAL | 0 refills | Status: AC

## 2016-07-25 MED ORDER — FERROUS SULFATE 325 (65 FE) MG PO TABS: 325.0000 mg | ORAL_TABLET | Freq: Two times a day (BID) | ORAL | 3 refills | Status: AC

## 2016-07-25 MED ORDER — OXYCODONE HCL 5 MG PO TABS: 5.0000 mg | ORAL_TABLET | Freq: Once | ORAL | 0 refills | Status: AC | PRN

## 2016-07-25 NOTE — Progress Notes (Signed)
Patient discharged home with infant. Discharge instructions, prescriptions and follow up appointment given to and reviewed with patient. Patient verbalized understanding. Escorted out via wheelchair by auxiliary.

## 2016-07-25 NOTE — Anesthesia Postprocedure Evaluation (Signed)
Anesthesia Post Note  Patient: [name]  Procedure(s) Performed: * No procedures listed *  Patient location during evaluation: Mother Baby Anesthesia Type: Epidural Level of consciousness: awake, awake and alert and oriented Pain management: satisfactory to patient Vital Signs Assessment: post-procedure vital signs reviewed and stable Respiratory status: spontaneous breathing Cardiovascular status: blood pressure returned to baseline Postop Assessment: no headache, no backache, no signs of nausea or vomiting and adequate PO intake Anesthetic complications: no     Last Vitals:  Vitals:   07/25/16 0011 07/25/16 0718  BP: 117/69 113/73  Pulse: 60 83  Resp: 18 18  Temp: 36.6 C 36.7 C    Last Pain:  Vitals:   07/25/16 0730  TempSrc:   PainSc: 5                  Shalana Jardin
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
